# Patient Record
Sex: Female | Born: 1960 | Race: White | Hispanic: No | Marital: Married | Smoking: Never smoker
Health system: Southern US, Community
[De-identification: ages and names within clinical notes are randomized; demographics above are authoritative.]

## PROBLEM LIST (undated history)

## (undated) DIAGNOSIS — K219 Gastro-esophageal reflux disease without esophagitis: Secondary | ICD-10-CM

## (undated) DIAGNOSIS — F32A Depression, unspecified: Secondary | ICD-10-CM

## (undated) DIAGNOSIS — M1990 Unspecified osteoarthritis, unspecified site: Secondary | ICD-10-CM

## (undated) DIAGNOSIS — F329 Major depressive disorder, single episode, unspecified: Secondary | ICD-10-CM

## (undated) DIAGNOSIS — J45909 Unspecified asthma, uncomplicated: Secondary | ICD-10-CM

## (undated) HISTORY — PX: BREAST SURGERY: SHX581

---

## 1999-10-10 ENCOUNTER — Inpatient Hospital Stay (HOSPITAL_COMMUNITY): Admission: AD | Admit: 1999-10-10 | Discharge: 1999-10-13 | Payer: Self-pay | Admitting: Gynecology

## 1999-12-09 ENCOUNTER — Other Ambulatory Visit: Admission: RE | Admit: 1999-12-09 | Discharge: 1999-12-09 | Payer: Self-pay | Admitting: Gynecology

## 2001-01-14 ENCOUNTER — Other Ambulatory Visit: Admission: RE | Admit: 2001-01-14 | Discharge: 2001-01-14 | Payer: Self-pay | Admitting: Gynecology

## 2001-06-03 ENCOUNTER — Observation Stay (HOSPITAL_COMMUNITY): Admission: AD | Admit: 2001-06-03 | Discharge: 2001-06-04 | Payer: Self-pay | Admitting: Gynecology

## 2001-06-03 ENCOUNTER — Ambulatory Visit (HOSPITAL_COMMUNITY): Admission: RE | Admit: 2001-06-03 | Discharge: 2001-06-03 | Payer: Self-pay | Admitting: Gynecology

## 2001-06-03 ENCOUNTER — Encounter: Payer: Self-pay | Admitting: Gynecology

## 2001-07-26 ENCOUNTER — Inpatient Hospital Stay (HOSPITAL_COMMUNITY): Admission: AD | Admit: 2001-07-26 | Discharge: 2001-07-29 | Payer: Self-pay | Admitting: Gynecology

## 2001-09-11 ENCOUNTER — Other Ambulatory Visit: Admission: RE | Admit: 2001-09-11 | Discharge: 2001-09-11 | Payer: Self-pay | Admitting: Gynecology

## 2005-08-04 ENCOUNTER — Other Ambulatory Visit: Admission: RE | Admit: 2005-08-04 | Discharge: 2005-08-04 | Payer: Self-pay | Admitting: Gynecology

## 2006-08-17 ENCOUNTER — Other Ambulatory Visit: Admission: RE | Admit: 2006-08-17 | Discharge: 2006-08-17 | Payer: Self-pay | Admitting: Gynecology

## 2008-03-10 ENCOUNTER — Ambulatory Visit: Payer: Self-pay | Admitting: Gynecology

## 2008-03-10 ENCOUNTER — Other Ambulatory Visit: Admission: RE | Admit: 2008-03-10 | Discharge: 2008-03-10 | Payer: Self-pay | Admitting: Gynecology

## 2008-03-10 ENCOUNTER — Encounter: Payer: Self-pay | Admitting: Gynecology

## 2008-03-17 ENCOUNTER — Encounter: Payer: Self-pay | Admitting: Gynecology

## 2008-03-17 ENCOUNTER — Ambulatory Visit: Payer: Self-pay | Admitting: Gynecology

## 2008-07-20 ENCOUNTER — Ambulatory Visit: Payer: Self-pay | Admitting: Gynecology

## 2010-09-21 ENCOUNTER — Ambulatory Visit: Payer: 59 | Attending: Pediatrics | Admitting: Physical Therapy

## 2010-09-21 DIAGNOSIS — M545 Low back pain, unspecified: Secondary | ICD-10-CM | POA: Insufficient documentation

## 2010-09-21 DIAGNOSIS — M2560 Stiffness of unspecified joint, not elsewhere classified: Secondary | ICD-10-CM | POA: Insufficient documentation

## 2010-09-21 DIAGNOSIS — IMO0001 Reserved for inherently not codable concepts without codable children: Secondary | ICD-10-CM | POA: Insufficient documentation

## 2010-09-22 ENCOUNTER — Ambulatory Visit: Payer: 59 | Admitting: Rehabilitation

## 2010-09-23 NOTE — H&P (Signed)
[facility]  Patient:    Yolanda Marshall, Yolanda Marshall Visit Number: [id number] MRN: [id number]          Service Type: OBS Location: 910A 9129 01 Attending Physician:  Wetzel Bjornstad Dictated by:   Douglass Rivers, M.D. Admit Date:  06/03/2001                           History and Physical  CHIEF COMPLAINT:              Right lingular pneumonia.  HISTORY OF PRESENT ILLNESS:   The patient is a 50 year old G2, P45, with an LMP of October 24, 2000, and estimated date of confinement of July 31, 2001, estimated gestational age of 31-5/7 weeks, who presented to the office complaining of an upper respiratory tract infection and tightness in her chest, which has progressed despite a weeks worth of Ceftin. She has just completed the Ceftin, and in addition, she states that her cough is somewhat productive. She has no shortness of breath. She also began vomiting last night and Zofran ODT was called in and she had a good response. She began having diarrhea this morning, and was actually incontinent of stool. However, she has not vomited since the Zofran.  She was started on Ceftin on January 19 for a presumed bronchitis. She was seen in the office the following day, and her examination was felt to be unremarkable. The patient had a CBC, CMET and cold agglutinin drawn today, which are pending at the time of this dictation. She had a chest x-ray, PA and lateral which shows a lingular infiltrate consistent with pneumonia.  PAST OBSTETRIC HISTORY:       The patient is 31-5/7 weeks and has had an uncomplicated prenatal course with the acceptation of being advanced maternal age, and declined a amniocentesis. The patient had a previous cesarean section for breech and is planning a repeat. She is AB negative. Her husband is also Rh-negative.  PAST GYNECOLOGIC HISTORY:     Significant for unexplained infertility. She had diagnostic laparoscopy in 2000.  PAST MEDICAL HISTORY:          Negative.  PAST SURGICAL HISTORY:        Significant for the diagnostic laparoscopy in 2000, C-section 2001, and breast biopsy in 1998.  MEDICATIONS:                  1. Ceftin, just completed.                               2. Robitussin.                               3. Zofran ODT 4 mg q.8h. p.r.n.                               4. Prenatal vitamins and iron.  ALLERGIES:                    None.  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 She does not smoke nor drink.  PHYSICAL EXAMINATION:  GENERAL:  On physical examination she is a slightly ill-appearing, gravid female, in mild distress.  VITAL SIGNS:                  Blood pressure was 112/68.  HEART:                        On examination her heart is regular rate.  LUNGS:                        Her lungs had scattered wheezed throughout which cleared with deep inspiration and mild pulmonary toilet. She was noted to have coarse rhonchi on the right side. She was also noted to egophony at the right base and right anterior, as well as fremitus in both of these two places. The left side was negative for egophony and fremitus.  ABDOMEN:                      Gravid, soft, nontender. Fundal height of 31. No evidence of contractions. The fetal heart tones were auscultated.  PELVIC:                       On pelvic examination, her cervix was long, closed and high.  EXTREMITIES:                  No clubbing, cyanosis, or edema.  ASSESSMENT:                   Right lingular infiltrate, failed Ceftin therapy, cold agglutinins are pending. Expect that this could be an atypical pneumonia. She will be admitted to the hospital for macrolide therapy.  We will also consult with a infectious disease with regard to other pathogens to be covered. We will cover her diarrhea as needed which could be secondary to gastrointestinal bug or related to her recent Ceftin.  She will continue her Zofran ODT. We will follow up the results of her labs. Dictated by:   Douglass Rivers, M.D. Attending Physician:  Wetzel Bjornstad DD:  06/03/01 TD:  06/04/01 Job: [id number]

## 2010-09-23 NOTE — Op Note (Signed)
[facility]  Patient:    Yolanda Marshall, Yolanda Marshall                     MRN: [id number] Proc. Date: 10/10/99 Attending:  Merrily Pew                           Operative Report  PREOPERATIVE DIAGNOSIS:       Breech at term.  POSTOPERATIVE DIAGNOSIS:      Breech at term.  OPERATION:                    Primary cesarean section, low flat transverse.  SURGEON:                      Douglass Rivers, M.D.  ASSISTANT:                    Nadyne Coombes. Fontaine, M.D.  ANESTHESIA:                   Spinal.  ESTIMATED BLOOD LOSS:         600.  FINDINGS:                     A viable female in the frank breech presentation.  Clear amniotic fluid.  Normal uterus, tubes, and ovaries. Apgars 9 and 9.  Birth weight 3330 g.  Pathology:  None.  COMPLICATIONS:                None.  DESCRIPTION OF PROCEDURE:     The patient was taken to the operating room and spinal anesthesia was induced, then placed in the supine position with left lateral displacement, prepped and draped in the usual sterile fashion.  After adequate anesthesia was assured, a Pfannenstiel skin incision was made with a scalpel, carried to the underlying layer of fascia with a second blade, and the fascia was scored in the midline.  The incision was extended laterally with the Mayo scissors.  The inferior aspect of the fascial incision was grasped with Kochers, underlying rectus muscles were dissected off.  In a similar fashion, the superior aspect of the incision was grasped with Kochers and underlying rectus muscles were dissected off.  The rectus muscles were separate in the midline, the peritoneum was identified, and by blunt dissection, the peritoneal incision was then extended superiorly and inferiorly for visualization of the underlying bowel and bladder.  The bladder blade was inserted.  The orientation was confirmed.  The vesicouterine peritoneum was identified, tented up, and entered  sharply with Metzenbaums. The incision was extended laterally and the bladder flap was created digitally.  The bladder blade was then reinserted.  The lower uterine segment was incised in a transverse fashion with the scalpel.  Clear amniotic fluid was noted upon entering the cavity.  The infant was delivered from the frank breech presentation with the usual maneuvers.  The cord was cut and clamped and the infant handed off to the waiting pediatricians.  The cord bloods were obtained.  The uterus was massaged.  The placenta separated naturally.  The uterus was then cleared of all clots and debris.  The uterine incision was repaired with a running locked layer of 0 chromic.  The gutters were cleared of all clots and debris, the adnexa inspected and noted to be normal.  The pelvis was then irrigated.  Reinspection of the  incision assured hemostasis. Inspection of the peritoneum and muscle also assured hemostasis.   The fascia was then closed with 0 Vicryl, the subcutaneous tissue was irrigated.  The skin was closed with staples.  The patient tolerated the procedure well. Sponge, instrument, and needle counts correct x 2.  She was given Ancef intraoperatively and transferred to the PACU in stable condition. DD:  10/10/99 TD:  10/12/99 Job: [id number]

## 2010-09-23 NOTE — H&P (Signed)
[facility]  Patient:    Yolanda Marshall, Yolanda Marshall Visit Number: [id number] MRN: [id number]          Service Type: OBS Location: 910A 9129 01 Attending Physician:  Wetzel Bjornstad Dictated by:   Douglass Rivers, M.D. Admit Date:  06/03/2001 Discharge Date: 06/04/2001                           History and Physical  CHIEF COMPLAINT:              Term pregnancy, elective repeat cesarean section.  HISTORY OF PRESENT ILLNESS:   The patient is a 50 year old G2, P1-0-1-1, who delivered by C-section the first pregnancy because of breech, who presents now for elective repeat cesarean section.  Her LMP was October 24, 2000.  Estimated date of confinement is October 31, 2001.  Estimated gestational age is 29 weeks. The patient is without any complaints.  No contractions.  Good fetal movement. No vaginal bleeding and presents electively for surgery.  Her pregnancy was complicated by advanced maternal age, for which he had an amnio with normal chromosomes, AB-negative.  Her husband also is Rh negative.  She developed pneumonia at 31 weeks and required an overnight stay in the hospital for IV antibiotics after failing oral antibiotics, and then was continued on an oral regimen.  PRENATAL LABORATORY DATA:     She is AB-negative, antibody negative.  RPR nonreactive.  Rubella immune.  Hepatitis B surface antigen nonreactive.  HIV nonreactive.  GBS was deferred because she is having a C-section.  PAST OB/GYN HISTORY:          Significant for previous cesarean section in 2001.  He had diagnostic laparoscopy in 2000 for unexplained infertility.  She conceived this pregnancy ________ cycle.  Pap smears have been normal and her periods are regular.  PAST MEDICAL HISTORY:         Significant only for H. pylori in 1998.  PAST SURGICAL HISTORY:        Cesarean section and laparoscopy as mentioned above plus a breast biopsy in 1998.  FAMILY HISTORY:                Negative.  SOCIAL HISTORY:               She is married.  No alcohol, tobacco, or drugs.  MEDICATIONS:                  Prenatal vitamins and iron.  PHYSICAL EXAMINATION:  GENERAL:                      She is a gravid female in no acute distress.  HEENT:                        Unremarkable.  NECK:                         Thyroid is nontender and mobile.  HEART:                        Regular rate.  LUNGS:                        Clear to auscultation.  BREASTS:  Without mass, discharge, or retractions in the supine and upright position.  ABDOMEN:                      Gravid with fundal height of 38.  Fetal heart tones were auscultated.  VAGINAL EXAMINATION:          She is well-enclosed and posterior.  EXTREMITIES:                  No edema.  ASSESSMENT:                   Thirty-nine plus weeks for elective repeat                               cesarean section.  PLAN:                         Risks and benefits of the procedure were discussed with the patient including risks of bleeding, infection, and damage to the underlying bladder.  These were accepted.  The patient will therefore undergo surgery as scheduled. Dictated by:   Douglass Rivers, M.D. Attending Physician:  Wetzel Bjornstad DD:  07/25/01 TD:  07/25/01 Job: [id number]

## 2010-09-23 NOTE — Discharge Summary (Signed)
[facility]  Patient:    Yolanda Marshall, Yolanda Marshall                     MRN: [id number] Adm. Date:  [id number] Disc. Date: [id number] Attending:  Douglass Rivers Dictator:   Antony Contras, RNC, [facility]                           Discharge Summary  DISCHARGE DIAGNOSES:          1. Intrauterine pregnancy at 40-1/7 weeks.                               2. Homero Fellers breech presentation.  PROCEDURE:                    Low transverse cesarean section with delivery of viable infant.  HISTORY OF PRESENT ILLNESS:   The patient is a 50 year old, gravida 1, para 0-0-0, with an LMP of January 07, 1999, [facility] October 09, 1999.  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  The patient is also rh negative and advanced maternal age. The patient did decline amniocentesis and AFP.  PRENATAL LABORATORY DATA:     Blood type is AB negative.  Antibody screen negative, rubella immune.  RPR, HBSAG, and HIV nonreactive.  GBS negative.  HOSPITAL COURSE:              The patient was admitted on October 10, 1999, for low  transverse cesarean section secondary to breech presentation.  The procedure was performed by Douglass Rivers, M.D. and assisted by Nadyne Coombes. Fontaine, M.D. under spinal anesthesia.  The patient was delivered of an Apgars 9 and 9 female infant, weight 7 pounds 5 ounces.  Postpartum course was uncomplicated.  The patient remained afebrile with no difficulty voiding.  She did have some breastfeeding concerns and was seen by the lactation consultant during her stay.  Her baby was rh negative so she was not a RhoGAM candidate.  She was able to be discharged on her third postoperative day in satisfactory condition.  DISPOSITION:                  Follow up in the office in six weeks.  Continue with prenatal vitamins and iron.  Postpartum CBC; hematocrit was 32.6, hemoglobin 10.9, WBC 12.5, and patient 214.  Motrin and Tylox for pain. DD:  11/01/99 TD:  11/01/99 Job:  [id number]

## 2010-09-23 NOTE — Discharge Summary (Signed)
[facility]  Patient:    Yolanda Marshall, STANTZ Visit Number: [id number] MRN: [id number]          Service Type: OBS Location: 910A 9129 01 Attending Physician:  Wetzel Bjornstad Dictated by:   Antony Contras, [facility] Admit Date:  06/03/2001 Discharge Date: 06/04/2001                             Discharge Summary  DISCHARGE DIAGNOSIS:          Atypical pneumonia at 30 weeks. HISTORY OF PRESENT ILLNESS:   The patient is a 50 year old, gravida 2, para 1, with an LMP of October 24, 2000, [facility] July 31, 2001, estimated gestational age on admission 31.5 weeks. The patient presented to the office a week ago with upper respiratory infection and tightness which progressed despite a weeks worth of Ceftin. The patient has continued to complain of a cough and also did develop some nausea, vomiting, and diarrhea. Chest x-ray PA and lateral showed a _______ infiltrate consistent with pneumonia. Patient was admitted to the hospital for macrolide therapy and also infectious disease consult was obtained.  HOSPITAL COURSE/TREATMENT:    The patient was admitted for IV cephalexin and Zithromax. She was feeling much better by the next day and was able to be discharged in satisfactory condition.  LABORATORY DATA:              WBC was 6.6.  DISCHARGE FOLLOWUP:           The patient is to follow up in the office on June 07, 2001.  DISPOSITION:                  Follow up for routine prenatal care. Follow up for post-hospital evaluation June 07, 2001. Dictated by:   Antony Contras, [facility] Attending Physician:  Wetzel Bjornstad DD:  06/14/01 TD:  06/16/01 Job: [id number]

## 2010-09-23 NOTE — Op Note (Signed)
[facility]  Patient:    Yolanda Marshall, Yolanda Marshall Visit Number: [id number] MRN: [id number]          Service Type: OBS Location: 910A 9119 01 Attending Physician:  Douglass Rivers Dictated by:   Douglass Rivers, M.D. Proc. Date: 07/26/01 Admit Date:  07/26/2001                             Operative Report  PREOPERATIVE DIAGNOSES:       1. Previous cesarean section.                               2. Intrauterine pregnancy at 39+ weeks.  POSTOPERATIVE DIAGNOSES:      1. Previous cesarean section.                               2. Intrauterine pregnancy at 39+ weeks.  PROCEDURE:                    Elective repeat cesarean section.  SURGEON:                      Douglass Rivers, M.D.  ASSISTANT:                    Katy Fitch, M.D.  ANESTHESIA:                   Spinal.  ESTIMATED BLOOD LOSS:         400 cc.  URINE OUTPUT:                 100 cc of clear urine.  FINDINGS:                     Viable female infant in the vertex presentation. Clear amniotic fluid.  Apgars 9 and 9.  Birth weight 713.  Normal uterus, tubes, and ovaries.  COMPLICATIONS:                None.  PATHOLOGY:                    None.  PROCEDURE:                    Patient was taken to the operating room.  Spinal anesthesia was induced and placed in the supine position with left lateral tilt.  Prepped and draped using sterile fashion.  A Pfannenstiel skin incision with the scalpel was carried through the underlying layer.  Fascia was dissected with electrocautery.  The rectus muscles were noted to be markedly separated and markedly scarred.  The inferior aspect of the fascial incision was grasped with Kochers.  Underlying rectus muscles were dissected off sharply.  The superior aspect of the incision was grasped with Kochers.  The underlying rectus muscles were separated off.  The peritoneal cavity was entered incidentally secondary to the large diastasis of the muscles.  With  an examining finger we were able to be aware that we were free of underlying bowel.  The incision was carried superiorly.  The rectus muscles inferiorly were separated sharply because of some scar tissue.  There was a small amount of bleeding noted around the space of Retzius that was treated with a figure-of-eight of chromic and was noted  to be hemostatic.  Attention was then turned back to the incision.  The bladder blade was inserted.  The orientation was confirmed.  The vesicouterine peritoneum was identified, tented up, entered sharply with Metzenbaum scissors.  Incision was then extended laterally.  The bladder flap was created digitally.  There was noted to be ______ of the lower uterine segment which was incised with a scalpel.  Upon entering the cavity a copious amount of clear amniotic fluid was noted.  The infant was delivered from the vertex presentation.  Cord was cut and clamped and then handed off to the awaiting pediatricians.  Cord bloods were obtained. The uterus was massaged and the placenta was allowed to separate naturally. The uterus was then cleared of all clots and debris.  The uterine incision as repaired with a running locked layer of 0 chromic.  The pelvis was irrigated with copious amounts of warm saline.  The adnexa were inspected and noted to be unremarkable.  Reinspection of the incision assured [place] of hemostasis.  As we began to close the fascia with 0 Vicryl we noticed what appeared to be a large amount of fluid that kept welling up in the incision.  We had already checked the space of Retzius which was noted to be hemostatic.  Because we had entered the peritoneum sharply incidentally the decision was made to retrograde fill the bladder with methylene blue stained normal saline.  After 350 cc were placed we assure ourselves that there was no defect in the integrity of the bladder.  The fluid was removed and the fascia was then closed with 0 Vicryl in a  running fashion.  The subcutaneous was irrigated and the skin was closed with staples.  The patient tolerated the procedure well. Sponge, lap, and needle counts were correct x2.  She was given Ancef intraoperatively and transferred to the PACU in stable condition. Dictated by:   Douglass Rivers, M.D. Attending Physician:  Douglass Rivers DD:  07/26/01 TD:  07/29/01 Job: [id number]

## 2010-09-23 NOTE — Discharge Summary (Signed)
[facility]  Patient:    Yolanda Marshall, Yolanda Marshall Visit Number: [id number] MRN: [id number]          Service Type: OBS Location: 910A 9119 01 Attending Physician:  Douglass Rivers Dictated by:   Antony Contras, [facility] Admit Date:  07/26/2001 Discharge Date: 07/29/2001                             Discharge Summary  DISCHARGE DIAGNOSES:          1. Intrauterine pregnancy at 39 weeks.                               2. History of previous cesarean section.  PROCEDURES,                   Elective repeat cesarean section.  HISTORY OF PRESENT ILLNESS:   The patient is a 51 year old, gravida 2, para 1-0-0-1, with LMP October 24, 2000, [facility] March 26,2003.  Prenatal risk factors include history of previous cesarean section.  The patient desires repeat. The patient is also Rh negative and so is husband.  PRENATAL LABORATORY DATA:     Blood type AB negative, antibody screen negative, RPR, HBsAg, HIV nonreactive.  The patient declines MSAFP.  HOSPITAL COURSE AND TREATMENT: The patient was admitted at 39 weeks for elective repeat cesarean section.  Procedure was performed by Dr. Farrel Gobble, assisted by Dr. Penni Homans under spinal anesthesia.  Findings include viable infant in vertex presentation, female, clear amniotic fluid, Apgars 9/9, birth weight 7 pounds 13 ounces.  Normal uterus, tubes, and ovaries.  During postpartum course, patient remained afebrile.  She had no difficulty voiding.  She was able to be discharged in satisfactory condition on her third postoperative day.  CBC hematocrit 32.8, hemoglobin 11, WBC 12.3, platelets 208.  DISPOSITION:                  Follow up in office in six weeks.  Continue prenatal vitamins and iron.  Motrin and Tylox for pain. Dictated by:   Antony Contras, [facility] Attending Physician:  Douglass Rivers DD:  08/12/01 TD:  08/12/01 Job: [id number]

## 2010-09-26 ENCOUNTER — Ambulatory Visit: Payer: 59 | Admitting: Rehabilitation

## 2010-09-28 ENCOUNTER — Ambulatory Visit: Payer: 59 | Admitting: Physical Therapy

## 2010-10-04 ENCOUNTER — Ambulatory Visit: Payer: 59 | Admitting: Rehabilitation

## 2010-10-06 ENCOUNTER — Ambulatory Visit: Payer: 59 | Admitting: Rehabilitation

## 2010-10-10 ENCOUNTER — Ambulatory Visit: Payer: 59 | Attending: Pediatrics | Admitting: Physical Therapy

## 2010-10-10 DIAGNOSIS — M545 Low back pain, unspecified: Secondary | ICD-10-CM | POA: Insufficient documentation

## 2010-10-10 DIAGNOSIS — M2560 Stiffness of unspecified joint, not elsewhere classified: Secondary | ICD-10-CM | POA: Insufficient documentation

## 2010-10-10 DIAGNOSIS — IMO0001 Reserved for inherently not codable concepts without codable children: Secondary | ICD-10-CM | POA: Insufficient documentation

## 2010-10-12 ENCOUNTER — Encounter: Payer: 59 | Admitting: Physical Therapy

## 2010-10-17 ENCOUNTER — Encounter: Payer: 59 | Admitting: Physical Therapy

## 2010-10-21 ENCOUNTER — Encounter: Payer: 59 | Admitting: Physical Therapy

## 2011-06-23 ENCOUNTER — Emergency Department (HOSPITAL_BASED_OUTPATIENT_CLINIC_OR_DEPARTMENT_OTHER)
Admission: EM | Admit: 2011-06-23 | Discharge: 2011-06-23 | Disposition: A | Discharge status: Home / Self Care | Payer: 59 | Attending: Emergency Medicine | Admitting: Emergency Medicine

## 2011-06-23 ENCOUNTER — Emergency Department (INDEPENDENT_AMBULATORY_CARE_PROVIDER_SITE_OTHER): Payer: 59

## 2011-06-23 ENCOUNTER — Encounter (HOSPITAL_BASED_OUTPATIENT_CLINIC_OR_DEPARTMENT_OTHER): Payer: Self-pay | Admitting: *Deleted

## 2011-06-23 DIAGNOSIS — M25429 Effusion, unspecified elbow: Secondary | ICD-10-CM | POA: Insufficient documentation

## 2011-06-23 DIAGNOSIS — W1809XA Striking against other object with subsequent fall, initial encounter: Secondary | ICD-10-CM | POA: Insufficient documentation

## 2011-06-23 DIAGNOSIS — S52123A Displaced fracture of head of unspecified radius, initial encounter for closed fracture: Secondary | ICD-10-CM

## 2011-06-23 DIAGNOSIS — Y92009 Unspecified place in unspecified non-institutional (private) residence as the place of occurrence of the external cause: Secondary | ICD-10-CM | POA: Insufficient documentation

## 2011-06-23 DIAGNOSIS — X58XXXA Exposure to other specified factors, initial encounter: Secondary | ICD-10-CM

## 2011-06-23 DIAGNOSIS — Z79899 Other long term (current) drug therapy: Secondary | ICD-10-CM | POA: Insufficient documentation

## 2011-06-23 HISTORY — DX: Depression, unspecified: F32.A

## 2011-06-23 HISTORY — DX: Major depressive disorder, single episode, unspecified: F32.9

## 2011-06-23 HISTORY — DX: Unspecified osteoarthritis, unspecified site: M19.90

## 2011-06-23 MED ORDER — HYDROCODONE-ACETAMINOPHEN 5-325 MG PO TABS: 2.0000 | ORAL_TABLET | ORAL | Status: AC | PRN

## 2011-06-23 NOTE — ED Provider Notes (Signed)
History     CSN: [id number]  Arrival date & time 06/23/11  1742   First MD Initiated Contact with Patient 06/23/11 1852      Chief Complaint  Patient presents with  . Arm Injury    (Consider location/radiation/quality/duration/timing/severity/associated sxs/prior treatment) HPI Comments: Patient presents with head and right elbow pain after falling on a pogo stick. She hit her head against a refrigerator but did not lose consciousness. She also injured her right elbow. There is pain to palpation and with flexion and extension. She denies any weakness, numbness, tingling, chest pain, abdominal pain or shortness of breath.  The history is provided by the patient.    Past Medical History  Diagnosis Date  . Arthritis   . Depression     Past Surgical History  Procedure Date  . Breast surgery     No family history on file.  History  Substance Use Topics  . Smoking status: Never Smoker   . Smokeless tobacco: Not on file  . Alcohol Use: No    OB History    Grav Para Term Preterm Abortions TAB SAB Ect Mult Living                  Review of Systems  All other systems reviewed and are negative.    Allergies  Review of patient's allergies indicates no known allergies.  Home Medications   Current Outpatient Rx  Name Route Sig Dispense Refill  . ACETAMINOPHEN 500 MG PO TABS Oral Take 1,000 mg by mouth every 6 (six) hours as needed. For pain    . BUSPIRONE HCL 15 MG PO TABS Oral Take 15 mg by mouth 2 (two) times daily.    Marland Kitchen DIPHENHYDRAMINE HCL 25 MG PO CAPS Oral Take 25 mg by mouth every 6 (six) hours as needed. For allergies    . ESCITALOPRAM OXALATE 20 MG PO TABS Oral Take 20 mg by mouth daily.    Marland Kitchen HYDROCODONE-ACETAMINOPHEN 5-500 MG PO TABS Oral Take 1 tablet by mouth every 6 (six) hours as needed. For pain    . HYDROCODONE-ACETAMINOPHEN 5-325 MG PO TABS Oral Take 2 tablets by mouth every 4 (four) hours as needed for pain. 10 tablet 0    BP 125/81  Pulse 97   Temp(Src) 98.6 F (37 C) (Oral)  Resp 20  SpO2 100%  LMP 06/02/2011  Physical Exam  Constitutional: She is oriented to person, place, and time. She appears well-developed and well-nourished. No distress.  HENT:  Head: Normocephalic and atraumatic.  Mouth/Throat: Oropharynx is clear and moist. No oropharyngeal exudate.  Eyes: Conjunctivae are normal. Pupils are equal, round, and reactive to light.  Neck: Normal range of motion. Neck supple.       No C-spine pain, step-off or foreign  Cardiovascular: Normal rate, regular rhythm and normal heart sounds.   Pulmonary/Chest: Effort normal and breath sounds normal. No respiratory distress.  Abdominal: Soft. There is no tenderness. There is no rebound and no guarding.  Musculoskeletal: Normal range of motion. She exhibits no edema and no tenderness.       Tenderness to palpation over the right lateral and medial epicondyle. Pain rigors range of motion with flexion, extension, pronation and supination. Posterior pulse, cardinal hand movements intact  Neurological: She is alert and oriented to person, place, and time. No cranial nerve deficit.  Skin: Skin is warm.    ED Course  Procedures (including critical care time)  Labs Reviewed - No data to display Dg Elbow  Complete Right  06/23/2011  *RADIOLOGY REPORT*  Clinical Data: Right forearm and elbow injury today.  Elbow pain.  RIGHT ELBOW - COMPLETE 3+ VIEW  Comparison: None.  Findings: There is a nondisplaced right radial head fracture involving the lateral aspect of the radial head and with intra- articular extension.  Elbow effusion is present with anterior fat pad sign.  The ulna appears intact.  Alignment the elbow is anatomic.  Capitellum appears normal.  IMPRESSION: Nondisplaced lateral right radial head fracture.  Original Report Authenticated By: Andreas Newport, M.D.   Dg Forearm Right  06/23/2011  *RADIOLOGY REPORT*  Clinical Data: Right forearm and elbow injury.  Pain.  RIGHT FOREARM -  2 VIEW  Comparison: None.  Findings: There is a nondisplaced right radial head fracture noted on the frontal view.  Effusion is present on the lateral view with displacement of the anterior fat pad.  The radial head fracture is difficult to appreciate on the lateral view.  IMPRESSION: Nondisplaced right radial head fracture.  Original Report Authenticated By: Andreas Newport, M.D.     1. Radial head fracture       MDM  Right elbow injury after fall. Minor head injury without loss of consciousness or neurological deficit.  X-ray consistent with radial head fracture. Sling, pain control, ortho referral.        Glynn Octave, MD 06/23/11 1950

## 2011-06-23 NOTE — ED Notes (Signed)
Pt. Alert and oriented, discharged to home, pt. Ambulatory gait steady, NAD noted 

## 2011-06-23 NOTE — Discharge Instructions (Signed)
Elbow Fracture, Simple     A fracture is a break in one of the bones.When fractures are not displaced or separated, they may be treated with only a sling or splint. The sling or splint may only be required for two to three weeks. In these cases, often the elbow is put through early range of motion exercises to prevent the elbow from getting stiff.  DIAGNOSIS   The diagnosis (learning what is wrong) of a fractured elbow is made by x-ray. These may be required before and after the elbow is put into a splint or cast. X-rays are taken after to make sure the bone pieces have not moved.  HOME CARE INSTRUCTIONS   · Only take over-the-counter or prescription medicines for pain, discomfort, or fever as directed by your caregiver.   · If you have a splint held on with an elastic wrap and your hand or fingers become numb or cold and blue, loosen the wrap and reapply more loosely. See your caregiver if there is no relief.   · You may use ice for twenty minutes, four times per day, for the first two to three days.   · Use your elbow as directed.   · See your caregiver as directed. It is very important to keep all follow-up referrals and appointments in order to avoid any long-term problems with your elbow including chronic pain or stiffness.   SEEK IMMEDIATE MEDICAL CARE IF:   · There is swelling or increasing pain in elbow.   · You begin to lose feeling or experience numbness or tingling in your hand or fingers.   · You develop swelling of the hand and fingers.   · You get a cold or blue hand or fingers on affected side.   MAKE SURE YOU:   · Understand these instructions.   · Will watch your condition.   · Will get help right away if you are not doing well or get worse.   Document Released: 04/18/2001 Document Revised: 01/04/2011 Document Reviewed: 03/09/2009  [employer]® Patient Information ©2012 [employer].

## 2011-06-23 NOTE — ED Notes (Signed)
Right elbow and forearm injury. Fell while jumping on pogo stick.

## 2011-06-26 ENCOUNTER — Other Ambulatory Visit (HOSPITAL_BASED_OUTPATIENT_CLINIC_OR_DEPARTMENT_OTHER): Payer: Self-pay | Admitting: Pediatrics

## 2011-06-26 DIAGNOSIS — Z1231 Encounter for screening mammogram for malignant neoplasm of breast: Secondary | ICD-10-CM

## 2011-06-27 ENCOUNTER — Ambulatory Visit (HOSPITAL_BASED_OUTPATIENT_CLINIC_OR_DEPARTMENT_OTHER)
Admission: RE | Admit: 2011-06-27 | Discharge: 2011-06-27 | Disposition: A | Discharge status: Home / Self Care | Payer: 59 | Source: Ambulatory Visit | Attending: Pediatrics | Admitting: Pediatrics

## 2011-06-27 DIAGNOSIS — Z1231 Encounter for screening mammogram for malignant neoplasm of breast: Secondary | ICD-10-CM

## 2011-08-16 ENCOUNTER — Ambulatory Visit: Payer: 59 | Attending: Sports Medicine | Admitting: Physical Therapy

## 2011-08-16 DIAGNOSIS — M25539 Pain in unspecified wrist: Secondary | ICD-10-CM | POA: Insufficient documentation

## 2011-08-16 DIAGNOSIS — M25639 Stiffness of unspecified wrist, not elsewhere classified: Secondary | ICD-10-CM | POA: Insufficient documentation

## 2011-08-16 DIAGNOSIS — IMO0001 Reserved for inherently not codable concepts without codable children: Secondary | ICD-10-CM | POA: Insufficient documentation

## 2011-08-22 ENCOUNTER — Ambulatory Visit: Payer: 59 | Admitting: Rehabilitation

## 2011-08-23 ENCOUNTER — Ambulatory Visit: Payer: 59 | Admitting: Physical Therapy

## 2011-08-29 ENCOUNTER — Ambulatory Visit: Payer: 59 | Admitting: Physical Therapy

## 2011-08-30 ENCOUNTER — Ambulatory Visit: Payer: 59 | Admitting: Physical Therapy

## 2011-09-04 ENCOUNTER — Ambulatory Visit: Payer: 59 | Admitting: Physical Therapy

## 2011-09-05 ENCOUNTER — Ambulatory Visit: Payer: 59 | Admitting: Physical Therapy

## 2011-09-12 ENCOUNTER — Ambulatory Visit: Payer: 59 | Attending: Sports Medicine | Admitting: Physical Therapy

## 2011-09-12 DIAGNOSIS — IMO0001 Reserved for inherently not codable concepts without codable children: Secondary | ICD-10-CM | POA: Insufficient documentation

## 2011-09-12 DIAGNOSIS — M25639 Stiffness of unspecified wrist, not elsewhere classified: Secondary | ICD-10-CM | POA: Insufficient documentation

## 2011-09-12 DIAGNOSIS — M25539 Pain in unspecified wrist: Secondary | ICD-10-CM | POA: Insufficient documentation

## 2011-09-13 ENCOUNTER — Ambulatory Visit: Payer: 59 | Admitting: Physical Therapy

## 2012-07-15 ENCOUNTER — Other Ambulatory Visit (HOSPITAL_BASED_OUTPATIENT_CLINIC_OR_DEPARTMENT_OTHER): Payer: Self-pay | Admitting: Physical Medicine and Rehabilitation

## 2012-07-15 ENCOUNTER — Ambulatory Visit (HOSPITAL_BASED_OUTPATIENT_CLINIC_OR_DEPARTMENT_OTHER)
Admission: RE | Admit: 2012-07-15 | Discharge: 2012-07-15 | Disposition: A | Discharge status: Home / Self Care | Payer: 59 | Source: Ambulatory Visit | Attending: Physical Medicine and Rehabilitation | Admitting: Physical Medicine and Rehabilitation

## 2012-07-15 DIAGNOSIS — M79609 Pain in unspecified limb: Secondary | ICD-10-CM | POA: Insufficient documentation

## 2012-08-30 ENCOUNTER — Other Ambulatory Visit (HOSPITAL_BASED_OUTPATIENT_CLINIC_OR_DEPARTMENT_OTHER): Payer: Self-pay | Admitting: Physical Medicine and Rehabilitation

## 2012-08-30 DIAGNOSIS — IMO0002 Reserved for concepts with insufficient information to code with codable children: Secondary | ICD-10-CM

## 2012-09-21 ENCOUNTER — Ambulatory Visit (HOSPITAL_BASED_OUTPATIENT_CLINIC_OR_DEPARTMENT_OTHER)
Admission: RE | Admit: 2012-09-21 | Discharge: 2012-09-21 | Disposition: A | Discharge status: Home / Self Care | Payer: 59 | Source: Ambulatory Visit | Attending: Physical Medicine and Rehabilitation | Admitting: Physical Medicine and Rehabilitation

## 2012-09-21 DIAGNOSIS — M79609 Pain in unspecified limb: Secondary | ICD-10-CM | POA: Insufficient documentation

## 2012-09-21 DIAGNOSIS — IMO0002 Reserved for concepts with insufficient information to code with codable children: Secondary | ICD-10-CM

## 2012-09-21 DIAGNOSIS — R209 Unspecified disturbances of skin sensation: Secondary | ICD-10-CM | POA: Insufficient documentation

## 2013-01-11 ENCOUNTER — Emergency Department (HOSPITAL_BASED_OUTPATIENT_CLINIC_OR_DEPARTMENT_OTHER)
Admission: EM | Admit: 2013-01-11 | Discharge: 2013-01-12 | Disposition: A | Discharge status: Home / Self Care | Payer: 59 | Attending: Emergency Medicine | Admitting: Emergency Medicine

## 2013-01-11 ENCOUNTER — Emergency Department (HOSPITAL_BASED_OUTPATIENT_CLINIC_OR_DEPARTMENT_OTHER): Payer: 59

## 2013-01-11 ENCOUNTER — Encounter (HOSPITAL_BASED_OUTPATIENT_CLINIC_OR_DEPARTMENT_OTHER): Payer: Self-pay | Admitting: *Deleted

## 2013-01-11 DIAGNOSIS — F329 Major depressive disorder, single episode, unspecified: Secondary | ICD-10-CM | POA: Insufficient documentation

## 2013-01-11 DIAGNOSIS — Z3202 Encounter for pregnancy test, result negative: Secondary | ICD-10-CM | POA: Insufficient documentation

## 2013-01-11 DIAGNOSIS — F3289 Other specified depressive episodes: Secondary | ICD-10-CM | POA: Insufficient documentation

## 2013-01-11 DIAGNOSIS — R6883 Chills (without fever): Secondary | ICD-10-CM | POA: Insufficient documentation

## 2013-01-11 DIAGNOSIS — K219 Gastro-esophageal reflux disease without esophagitis: Secondary | ICD-10-CM | POA: Insufficient documentation

## 2013-01-11 DIAGNOSIS — J45909 Unspecified asthma, uncomplicated: Secondary | ICD-10-CM | POA: Insufficient documentation

## 2013-01-11 DIAGNOSIS — M549 Dorsalgia, unspecified: Secondary | ICD-10-CM | POA: Insufficient documentation

## 2013-01-11 DIAGNOSIS — Z79899 Other long term (current) drug therapy: Secondary | ICD-10-CM | POA: Insufficient documentation

## 2013-01-11 DIAGNOSIS — M129 Arthropathy, unspecified: Secondary | ICD-10-CM | POA: Insufficient documentation

## 2013-01-11 DIAGNOSIS — K5900 Constipation, unspecified: Secondary | ICD-10-CM | POA: Insufficient documentation

## 2013-01-11 DIAGNOSIS — R109 Unspecified abdominal pain: Secondary | ICD-10-CM | POA: Insufficient documentation

## 2013-01-11 DIAGNOSIS — R110 Nausea: Secondary | ICD-10-CM | POA: Insufficient documentation

## 2013-01-11 HISTORY — DX: Gastro-esophageal reflux disease without esophagitis: K21.9

## 2013-01-11 HISTORY — DX: Unspecified asthma, uncomplicated: J45.909

## 2013-01-11 LAB — CBC WITH DIFFERENTIAL/PLATELET
HCT: 39.6 % (ref 36.0–46.0)
Hemoglobin: 13.2 g/dL (ref 12.0–15.0)
Lymphs Abs: 1.8 10*3/uL (ref 0.7–4.0)
MCH: 31.4 pg (ref 26.0–34.0)
Monocytes Relative: 5 % (ref 3–12)
Neutro Abs: 10 10*3/uL — ABNORMAL HIGH (ref 1.7–7.7)
Neutrophils Relative %: 79 % — ABNORMAL HIGH (ref 43–77)
RBC: 4.21 MIL/uL (ref 3.87–5.11)

## 2013-01-11 LAB — URINALYSIS, ROUTINE W REFLEX MICROSCOPIC
Glucose, UA: NEGATIVE mg/dL
Hgb urine dipstick: NEGATIVE
Ketones, ur: NEGATIVE mg/dL
Leukocytes, UA: NEGATIVE
pH: 6.5 (ref 5.0–8.0)

## 2013-01-11 LAB — BASIC METABOLIC PANEL
BUN: 8 mg/dL (ref 6–23)
Chloride: 102 mEq/L (ref 96–112)
Glucose, Bld: 106 mg/dL — ABNORMAL HIGH (ref 70–99)
Potassium: 4.7 mEq/L (ref 3.5–5.1)

## 2013-01-11 MED ORDER — ONDANSETRON HCL 4 MG/2ML IJ SOLN: 4.0000 mg | Freq: Four times a day (QID) | INTRAMUSCULAR | Status: DC | PRN

## 2013-01-11 MED ORDER — MORPHINE SULFATE 4 MG/ML IJ SOLN: 4.0000 mg | INTRAMUSCULAR | Status: DC | PRN

## 2013-01-11 NOTE — ED Notes (Signed)
Pt denies any abd pain or nausea at this time. Request not to have IV placed at this time and did not want any meds for nausea or pain.

## 2013-01-11 NOTE — ED Notes (Signed)
Lower abdominal pain that started about 45 minutes ago with nausea.

## 2013-01-11 NOTE — ED Provider Notes (Signed)
CSN: [id number]     Arrival date & time 01/11/13  2013 History  This chart was scribed for Derwood Kaplan, MD by Ronal Fear, ED Scribe. This patient was seen in room MH04/MH04 and the patient's care was started at 9:20 PM.   Chief Complaint  Patient presents with  . Abdominal Pain   The history is provided by the patient. No language interpreter was used.    HPI Comments: Yolanda Marshall is a 52 y.o. female with h/o chronic constipation who presents to the Emergency Department complaining of gradually resolving lower abdominal pain described as a tightness that radiates to her back onet 1 hour PTA with associated nausea and chills. Pt states this feels like her normal pain from her constipation. She had a bowel movement which normally helps but didn't this time. She denies dysuria as an associated symptom. Pt has asthma and allergies. She is a nonsmoker. Pt denies diabetes, heart disease, or any other medical conditions.    Past Medical History  Diagnosis Date  . Arthritis   . Depression   . Asthma   . GERD (gastroesophageal reflux disease)    Past Surgical History  Procedure Laterality Date  . Breast surgery    . Cesarean section     History reviewed. No pertinent family history. History  Substance Use Topics  . Smoking status: Never Smoker   . Smokeless tobacco: Not on file  . Alcohol Use: No   OB History   Grav Para Term Preterm Abortions TAB SAB Ect Mult Living                 Review of Systems  Constitutional: Positive for chills.  Gastrointestinal: Positive for nausea, abdominal pain and constipation.  Genitourinary: Negative for dysuria.  Musculoskeletal: Positive for back pain.  All other systems reviewed and are negative.    Allergies  Review of patient's allergies indicates no known allergies.  Home Medications   Current Outpatient Rx  Name  Route  Sig  Dispense  Refill  . beclomethasone (QVAR) 40 MCG/ACT inhaler   Inhalation   Inhale 2 puffs into the  lungs 2 (two) times daily.         . diphenhydrAMINE (BENADRYL) 25 mg capsule   Oral   Take 25 mg by mouth every 6 (six) hours as needed. For allergies         . escitalopram (LEXAPRO) 20 MG tablet   Oral   Take 20 mg by mouth daily.         . montelukast (SINGULAIR) 10 MG tablet   Oral   Take 10 mg by mouth at bedtime.         Marland Kitchen omeprazole (PRILOSEC) 10 MG capsule   Oral   Take 10 mg by mouth daily.         Marland Kitchen acetaminophen (TYLENOL) 500 MG tablet   Oral   Take 1,000 mg by mouth every 6 (six) hours as needed. For pain         . busPIRone (BUSPAR) 15 MG tablet   Oral   Take 15 mg by mouth 2 (two) times daily.         Marland Kitchen HYDROcodone-acetaminophen (VICODIN) 5-500 MG per tablet   Oral   Take 1 tablet by mouth every 6 (six) hours as needed. For pain          BP 117/73  Pulse 56  Temp(Src) 97.9 F (36.6 C)  Resp 16  Ht 5\' 4"  (1.626 m)  Wt 199 lb (90.266 kg)  BMI 34.14 kg/m2  SpO2 99%  LMP 06/02/2011 Physical Exam  Nursing note and vitals reviewed. Constitutional: She is oriented to person, place, and time. She appears well-developed and well-nourished. No distress.  HENT:  Head: Normocephalic and atraumatic.  Eyes: EOM are normal.  Neck: Neck supple. No tracheal deviation present.  Cardiovascular: Normal rate, regular rhythm and normal heart sounds.   No murmur heard. Pulmonary/Chest: Effort normal and breath sounds normal. No respiratory distress. She has no wheezes. She has no rales.  Abdominal: Soft. There is no tenderness.  Musculoskeletal: Normal range of motion.  Neurological: She is alert and oriented to person, place, and time.  Skin: Skin is warm and dry.  Psychiatric: She has a normal mood and affect. Her behavior is normal.    ED Course  Procedures (including critical care time)  DIAGNOSTIC STUDIES: Oxygen Saturation is 99 on RA normal by my interpretation.    COORDINATION OF CARE: 8:35 PM- Pt advised of plan for treatment including  reasons for sudden onset pain, doing an acute abdominal series and a urine sample and pt agrees.   Labs Review Labs Reviewed  URINALYSIS, ROUTINE W REFLEX MICROSCOPIC  PREGNANCY, URINE   Imaging Review No results found.  MDM  No diagnosis found. I personally performed the services described in this documentation, which was scribed in my presence. The recorded information has been reviewed and is accurate.  Pt comes in with cc of abd pain. Pt has hx of constipation, chronic pain of similar nature - typically due to constipations. Today had more severe pain, radiating to the back and didn't respond to bowel movement, like he typically does.  Pt is in no pain at my evaluation, had mild left sided abd pain, LLQ. Pan was severe, so ddx - ovarian torsion, renal stone, SBO. No hx of renal stones, no uti like sx  Initial labs are WNL. Repeat exam - patient is pain free.  No leukocytosis - so we will not presume diverticulitis, in light of improving exam. Pt will see her pcp.     Derwood Kaplan, MD 01/12/13 [phone]

## 2013-01-12 MED ORDER — ONDANSETRON 8 MG PO TBDP: 8.0000 mg | ORAL_TABLET | Freq: Three times a day (TID) | ORAL | Status: AC | PRN

## 2013-01-12 NOTE — ED Notes (Signed)
Pt reports lower abd pain that began approx PTA, currently pt denies any abd pain - pt also admits to some nausea denies vomiting. Pt A&Ox4, in no acute distress, denies any associating symptoms.

## 2013-10-28 ENCOUNTER — Ambulatory Visit (INDEPENDENT_AMBULATORY_CARE_PROVIDER_SITE_OTHER): Payer: 59 | Admitting: Neurology

## 2013-10-28 ENCOUNTER — Encounter: Payer: Self-pay | Admitting: Neurology

## 2013-10-28 VITALS — BP 128/77 | HR 75 | Temp 98.0°F | Ht 64.0 in | Wt 204.0 lb

## 2013-10-28 DIAGNOSIS — R51 Headache: Secondary | ICD-10-CM

## 2013-10-28 DIAGNOSIS — G4710 Hypersomnia, unspecified: Secondary | ICD-10-CM

## 2013-10-28 DIAGNOSIS — R519 Headache, unspecified: Secondary | ICD-10-CM

## 2013-10-28 NOTE — Patient Instructions (Signed)
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 We will do further testing with a sleep study and a nap study. We may have to try different medications that may help you stay awake during the day. Not everything works with everybody the same way. Wake promoting agents include stimulants and non-stimulant type medications. The most common side effects with stimulants are weight loss, insomnia, nervousness, headaches, palpitations, rise in blood pressure, anxiety. Stimulants can be addictive and subject to abuse. Non-stimulant type wake promoting medications include Provigil and Nuvigil, most common side effects include headaches, nervousness, insomnia, hypertension.

## 2013-10-28 NOTE — Progress Notes (Signed)
Subjective:    Patient ID: Yolanda Marshall is a 53 y.o. female.  HPI    Huston Foley, MD, PhD [facility] [address] [address] [place], [place] [address]  Dear Dr. Hal Hope,   I saw your patient, Yolanda Marshall, upon your kind request in my neurologic clinic today for initial consultation of her sleep disorder, in particular her excessive somnolence. The patient is unaccompanied today. As you know, Yolanda Marshall is a 53 year old right-handed woman with an underlying medical history of depression, obesity, allergic rhinitis, and asthma, who has been experiencing significant daytime somnolence for the past several years, at least 10 years. She has been on an antidepressant off and on for years. She has been on Effexor and Zoloft and has attributed her sleepiness to underlying anxiety and depression. She has seen Dr. Jerre Simon in sleep clinic not too long ago. She has reduced her antidepressant but still has significant sleepiness. He scheduled her for a sleep study and not study. He advised her that she would have to stop the Lexapro at least 2 weeks prior to testing. 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 In preparation for the sleep studies, she came off of Buspar, and Lexapro, but had a very difficult time coming off of Lexapro in a matter of 2 weeks. She currently is off of both medication. She has a 51 yo and a 51 yo.  She currently does not feel depressed. She has 2 cats, sometimes in the bed. Her husband snores and she sleeps with earplugs. She naps for as long as 4-6 hours.  Her typical bedtime is reported to be around 10 to 10:30 PM and usual wake time is around 6:45 AM. Sleep onset typically occurs within minutes. She reports feeling poorly rested upon awakening. She wakes up on an average 1 times in the middle of the night and has to go to the bathroom 0 to 1 times on a typical night. She reports occasional  morning headaches. She has had a few episodes of sleep paralysis in her lifetime, and while she does not report frank cataplectic attacks, she feels drained after having had an emotional conversation. She denies hypnagogic or pump hallucinations. She has no history of sleep attacks. She does get overwhelmingly sleepy and feels that she has to take a nap and it can be several hours. She even sleeps when she is at work and her lunchtime. She drinks caffeine 2 times per day. She does not drink alcohol, as she has a strong FHx of alcoholism. She does not smoke.  She reports excessive daytime somnolence (EDS) and Her Epworth Sleepiness Score (ESS) is 13/24 today. She has dozed off driving, but now knows to pull over. She reports dreaming in a nap. Singulair made her act out in her dreams. She used to sleep talk and sleep walk as a child.  She does not snore and has not had apneas or sense of gasping. There is report of nighttime reflux, with rare nighttime cough experienced. The patient has not noted any RLS symptoms and is not known to kick while asleep or before falling asleep. There is no family history of RLS or OSA. Her bedroom is usually dark and cool. There is a TV in the bedroom and usually it is not on at night.   Her Past Medical History Is Significant For: Past Medical History  Diagnosis Date  . Arthritis   .  Depression   . Asthma   . GERD (gastroesophageal reflux disease)     Her Past Surgical History Is Significant For: Past Surgical History  Procedure Laterality Date  . Breast surgery    . Cesarean section      Her Family History Is Significant For: Family History  Problem Relation Age of Onset  . Depression Father     Strong paternal family Hx of depression  . Depression Sister     Her Social History Is Significant For: History   Social History  . Marital Status: Single    Spouse Name: N/A    Number of Children: N/A  . Years of Education: N/A   Social History Main  Topics  . Smoking status: Never Smoker   . Smokeless tobacco: None  . Alcohol Use: No  . Drug Use: No  . Sexual Activity: None   Other Topics Concern  . None   Social History Narrative  . None    Her Allergies Are:  Allergies  Allergen Reactions  . Meperidine Nausea Only  :   Her Current Medications Are:  Outpatient Encounter Prescriptions as of 10/28/2013  Medication Sig  . acetaminophen (TYLENOL) 500 MG tablet Take 1,000 mg by mouth every 6 (six) hours as needed. For pain  . albuterol (PROVENTIL) (2.5 MG/3ML) 0.083% nebulizer solution Take 2.5 mg by nebulization every 6 (six) hours as needed for wheezing or shortness of breath.  . beclomethasone (QVAR) 40 MCG/ACT inhaler Inhale 2 puffs into the lungs 2 (two) times daily.  . diphenhydrAMINE (BENADRYL) 25 mg capsule Take 50 mg by mouth every 6 (six) hours as needed. For allergies  . Melatonin 5 MG TABS Take 1 tablet by mouth at bedtime as needed and may repeat dose one time if needed.  . busPIRone (BUSPAR) 15 MG tablet Take 15 mg by mouth 2 (two) times daily.  Marland Kitchen escitalopram (LEXAPRO) 20 MG tablet Take 20 mg by mouth daily.  Marland Kitchen HYDROcodone-acetaminophen (VICODIN) 5-500 MG per tablet Take 1 tablet by mouth every 6 (six) hours as needed. For pain  . montelukast (SINGULAIR) 10 MG tablet Take 10 mg by mouth at bedtime.  Marland Kitchen omeprazole (PRILOSEC) 10 MG capsule Take 10 mg by mouth daily.  . ondansetron (ZOFRAN ODT) 8 MG disintegrating tablet Take 1 tablet (8 mg total) by mouth every 8 (eight) hours as needed for nausea.  :  Review of Systems:  Out of a complete 14 point review of systems, all are reviewed and negative with the exception of these symptoms as listed below:   Review of Systems  Constitutional: Positive for appetite change, fatigue and unexpected weight change.  HENT: Negative.   Eyes: Negative.   Respiratory: Negative.   Cardiovascular: Negative.   Gastrointestinal: Negative.   Endocrine: Negative.   Genitourinary:  Negative.   Musculoskeletal: Negative.   Skin: Negative.   Allergic/Immunologic: Positive for environmental allergies.  Neurological: Negative.   Hematological: Negative.   Psychiatric/Behavioral: Positive for sleep disturbance (e.d.s., ).    Objective:  Neurologic Exam  Physical Exam Physical Examination:   Filed Vitals:   10/28/13 0848  BP: 128/77  Pulse: 75  Temp: 98 F (36.7 C)    General Examination: The patient is a very pleasant 53 y.o. female in no acute distress. She appears well-developed and well-nourished and well groomed. She is overweight.  HEENT: Normocephalic, atraumatic, pupils are equal, round and reactive to light and accommodation. Funduscopic exam is normal with sharp disc margins noted. She wears corrective  lenses. Extraocular tracking is good without limitation to gaze excursion or nystagmus noted. Normal smooth pursuit is noted. Hearing is grossly intact. Tympanic membranes are clear bilaterally. Face is symmetric with normal facial animation and normal facial sensation. Speech is clear with no dysarthria noted. There is no hypophonia. There is no lip, neck/head, jaw or voice tremor. Neck is supple with full range of passive and active motion. There are no carotid bruits on auscultation. Oropharynx exam reveals: mild mouth dryness, adequate dental hygiene and mild airway crowding, due to narrow airway entry and elongated tongue. Mallampati is class I. Tongue protrudes centrally and palate elevates symmetrically. Tonsils are small in size. Nasal inspection reveals no significant nasal mucosal bogginess or redness and no septal deviation.   Chest: Clear to auscultation without wheezing, rhonchi or crackles noted.  Heart: S1+S2+0, regular and normal without murmurs, rubs or gallops noted.   Abdomen: Soft, non-tender and non-distended with normal bowel sounds appreciated on auscultation.  Extremities: There is no pitting edema in the distal lower extremities  bilaterally. Pedal pulses are intact.  Skin: Warm and dry without trophic changes noted. There are no varicose veins. She has residual evidence of poison ivy in her left forearm and both legs. She states she was treated with prednisone.  Musculoskeletal: exam reveals no obvious joint deformities, tenderness or joint swelling or erythema.   Neurologically:  Mental status: The patient is awake, alert and oriented in all 4 spheres. Her immediate and remote memory, attention, language skills and fund of knowledge are appropriate. There is no evidence of aphasia, agnosia, apraxia or anomia. Speech is clear with normal prosody and enunciation. Thought process is linear. Mood is normal and affect is normal.  Cranial nerves II - XII are as described above under HEENT exam. In addition: shoulder shrug is normal with equal shoulder height noted. Motor exam: Normal bulk, strength and tone is noted. There is no drift, tremor or rebound. Romberg is negative. Reflexes are 2+ throughout. Babinski: Toes are flexor bilaterally. Fine motor skills and coordination: intact with normal finger taps, normal hand movements, normal rapid alternating patting, normal foot taps and normal foot agility.  Cerebellar testing: No dysmetria or intention tremor on finger to nose testing. Heel to shin is unremarkable bilaterally. There is no truncal or gait ataxia.  Sensory exam: intact to light touch, pinprick, vibration, temperature sense in the upper and lower extremities.  Gait, station and balance: She stands easily. No veering to one side is noted. No leaning to one side is noted. Posture is age-appropriate and stance is narrow based. Gait shows normal stride length and normal pace. No problems turning are noted. She turns en bloc. Tandem walk is unremarkable. Intact toe and heel stance is noted.               Assessment and Plan:   In summary, Yolanda Marshall is a very pleasant 53 y.o.-year old female with an underlying  medical history of depression, obesity, allergic rhinitis, and asthma, who has been experiencing significant daytime somnolence for the past several years, at least 10 years. 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 While she does not have a telltale history of narcolepsy including sleep attacks, she does report significant daytime somnolence and the need to take sometimes prolonged naps. 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 I do believe her to hypersomnolence is out of proportion to any underlying mood disorder and medication effect. In fact, she has been off of BuSpar and Lexapro for about a  month and a half. She does not plan to start back on Lexapro. She does have some residual anxiety and may restart BuSpar after sleep testing. Her physical exam is nonfocal which is reassuring. She reports some morning headaches. She has a history of migraine headaches. She denies any sleep disordered breathing and I would have a low suspicion of obstructive sleep disordered breathing in her case.  I had a long chat with the patient about my findings and the symptom of excessive sleepiness. We briefly talked about the differential diagnosis of narcolepsy without cataplexy versus idiopathic hypersomnolence in her case. We also talked about medical treatments and non-pharmacological approaches. I would like to proceed with an overnight polysomnogram followed by a daytime nap test (MSLT). I don't think that narcolepsy can be completely excluded at this moment. While She does not endorse frank cataplexy, She has had sleep paralysis episodes. I also advised her that it can be normal to have sleep paralysis infrequently in your lifetime.  She is agreeable to coming back for further sleep study testing. I will see her back soon afterwards. Thank you very much for allowing me to participate in the care of this nice patient. If I can be of any further assistance to you please do not hesitate to call me at  [phone].  Sincerely,   Huston FoleySaima Athar, MD, PhD

## 2014-07-22 ENCOUNTER — Other Ambulatory Visit (HOSPITAL_BASED_OUTPATIENT_CLINIC_OR_DEPARTMENT_OTHER): Payer: Self-pay | Admitting: Family Medicine

## 2014-07-22 DIAGNOSIS — Z1231 Encounter for screening mammogram for malignant neoplasm of breast: Secondary | ICD-10-CM

## 2014-07-31 ENCOUNTER — Ambulatory Visit (HOSPITAL_BASED_OUTPATIENT_CLINIC_OR_DEPARTMENT_OTHER)
Admission: RE | Admit: 2014-07-31 | Discharge: 2014-07-31 | Disposition: A | Discharge status: Home / Self Care | Payer: 59 | Source: Ambulatory Visit | Attending: Family Medicine | Admitting: Family Medicine

## 2014-07-31 DIAGNOSIS — Z1231 Encounter for screening mammogram for malignant neoplasm of breast: Secondary | ICD-10-CM | POA: Diagnosis not present

## 2016-10-26 ENCOUNTER — Other Ambulatory Visit (HOSPITAL_BASED_OUTPATIENT_CLINIC_OR_DEPARTMENT_OTHER): Payer: Self-pay | Admitting: Ophthalmology

## 2016-10-26 DIAGNOSIS — H5347 Heteronymous bilateral field defects: Secondary | ICD-10-CM

## 2016-10-28 ENCOUNTER — Ambulatory Visit (HOSPITAL_BASED_OUTPATIENT_CLINIC_OR_DEPARTMENT_OTHER)
Admission: RE | Admit: 2016-10-28 | Discharge: 2016-10-28 | Disposition: A | Discharge status: Home / Self Care | Payer: 59 | Source: Ambulatory Visit | Attending: Ophthalmology | Admitting: Ophthalmology

## 2016-10-28 DIAGNOSIS — H5347 Heteronymous bilateral field defects: Secondary | ICD-10-CM | POA: Diagnosis not present

## 2016-10-28 MED ORDER — GADOBENATE DIMEGLUMINE 529 MG/ML IV SOLN: 15.0000 mL | Freq: Once | INTRAVENOUS | Status: DC | PRN

## 2018-03-28 IMAGING — MR MR ORBITS WO/W CM
8 series · 48 of 48 positions shown · IV contrast (multihance)
Comparison: None.

CLINICAL DATA: Peripheral vision loss. Heteronymous bilateral
visual field defects. Flashing lights.

EXAM:
MRI OF THE ORBITS WITHOUT AND WITH CONTRAST
TECHNIQUE: Multiplanar, multisequence MR imaging of the orbits was performed
both before and after the administration of intravenous contrast.
CONTRAST:  MultiHance 15 mL.

[Series 2: T1 · sagittal · 5.0mm · 0.90mm/px · 5 of 23 slices shown (1 of 3)]
[im 1/23]
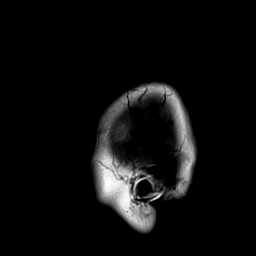
[im 6/23]
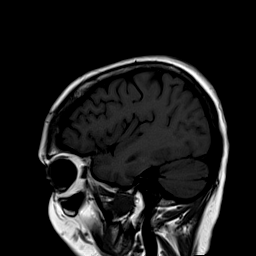
[im 12/23]
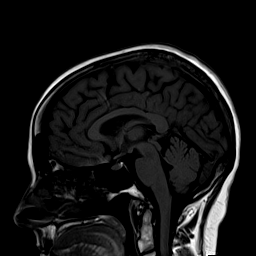
[im 17/23]
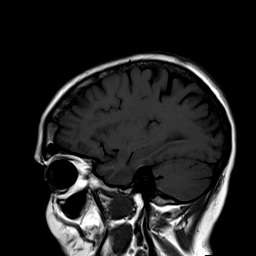
[im 23/23]
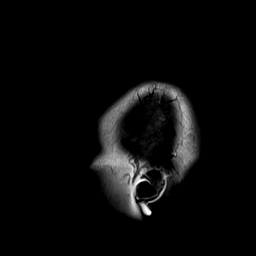

[Series 3: T2 fat-sat · axial · 3.0mm · 0.62mm/px · z∈[-40,+26]mm · 6 of 21 slices shown (1 of 2)]
[im 1/21]
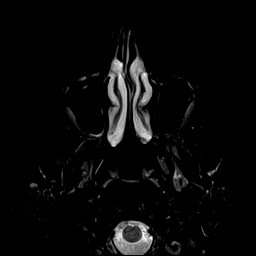
[im 5/21]
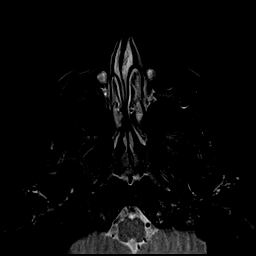
[im 9/21]
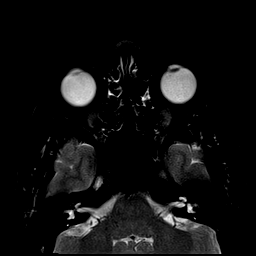
[im 13/21]
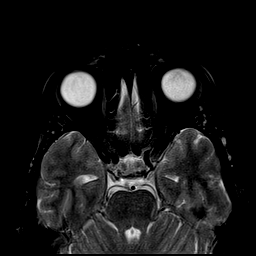
[im 17/21]
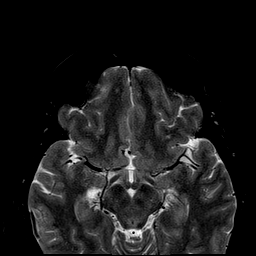
[im 21/21]
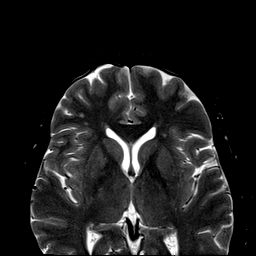

[Series 4: T2 fat-sat · coronal · 3.0mm · 0.62mm/px · 8 of 30 slices shown (2 of 2)]
[im 1/30]
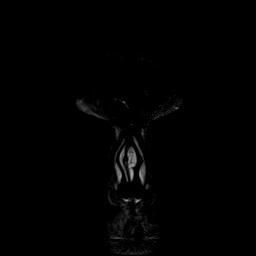
[im 5/30]
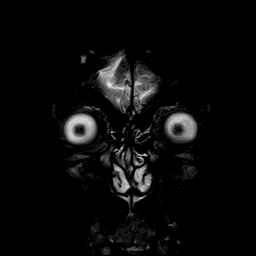
[im 9/30]
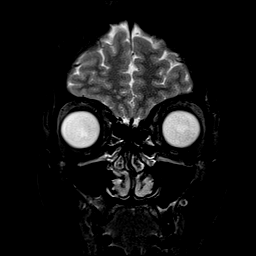
[im 13/30]
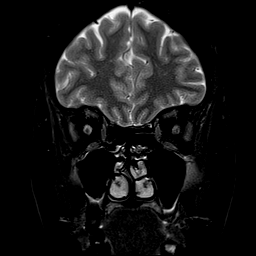
[im 17/30]
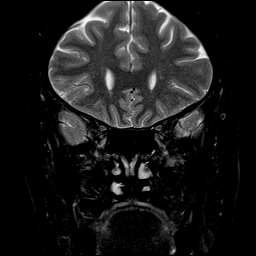
[im 21/30]
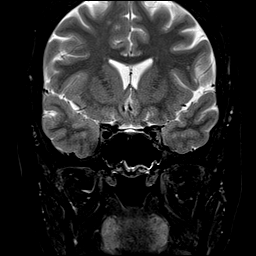
[im 25/30]
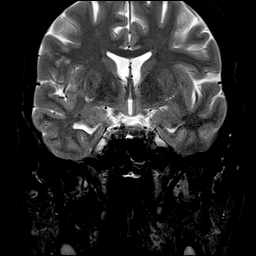
[im 30/30]
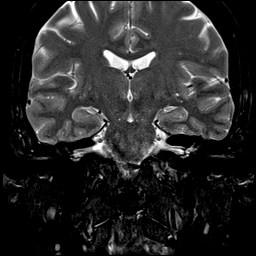

[Series 5: T1 · axial · 3.0mm · 0.62mm/px · z∈[-40,+26]mm · 6 of 21 slices shown (2 of 3)]
[im 1/21]
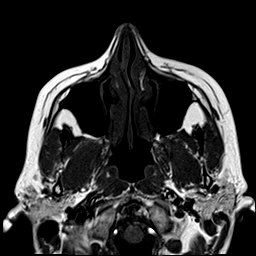
[im 5/21]
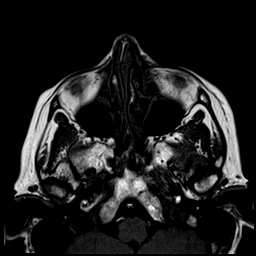
[im 9/21]
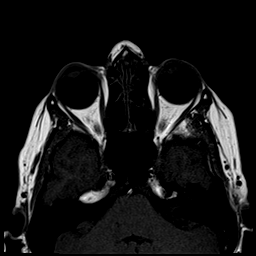
[im 13/21]
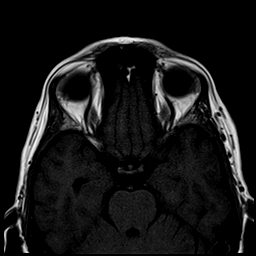
[im 17/21]
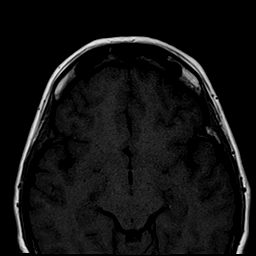
[im 21/21]
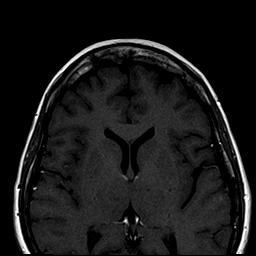

[Series 6: T1 · coronal · 3.0mm · 0.62mm/px · 8 of 30 slices shown (3 of 3)]
[im 1/30]
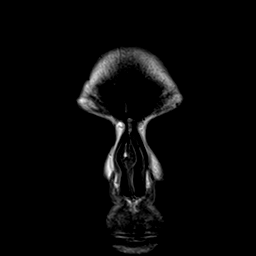
[im 5/30]
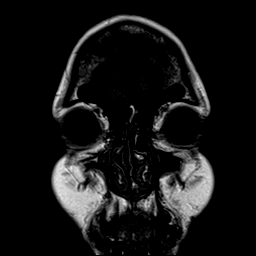
[im 9/30]
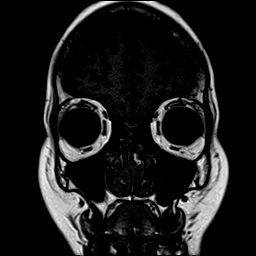
[im 13/30]
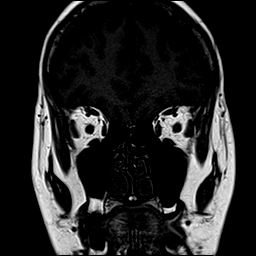
[im 17/30]
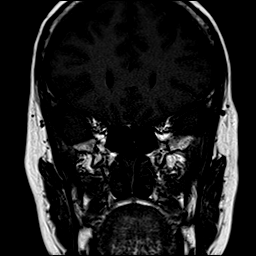
[im 21/30]
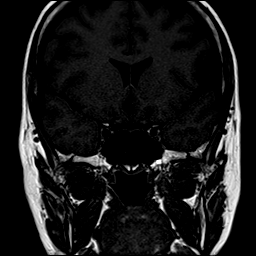
[im 25/30]
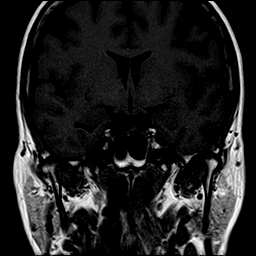
[im 30/30]
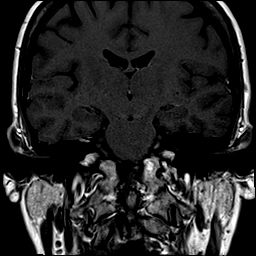

[Series 7: T1 fat-sat · axial · 3.0mm · 0.62mm/px · z∈[-40,+26]mm · 6 of 21 slices shown (1 of 2)]
[im 1/21]
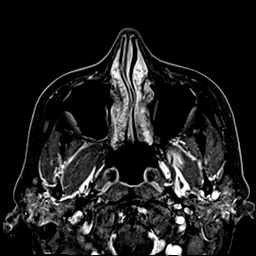
[im 5/21]
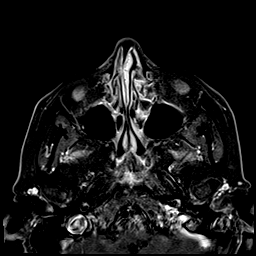
[im 9/21]
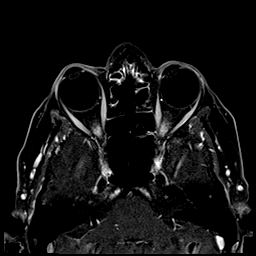
[im 13/21]
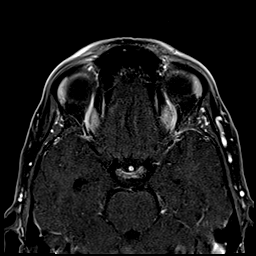
[im 17/21]
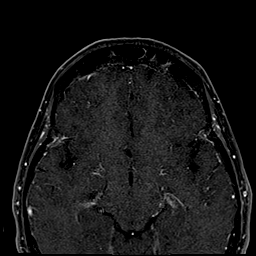
[im 21/21]
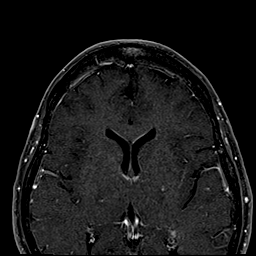

[Series 8: T1 fat-sat · coronal · 3.0mm · 0.62mm/px · 8 of 30 slices shown (2 of 2)]
[im 1/30]
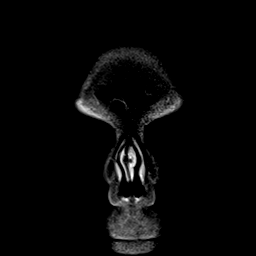
[im 5/30]
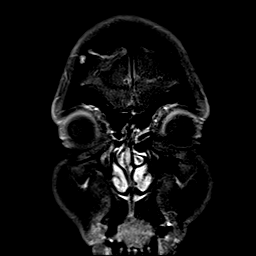
[im 9/30]
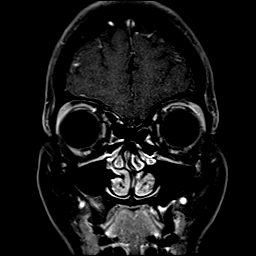
[im 13/30]
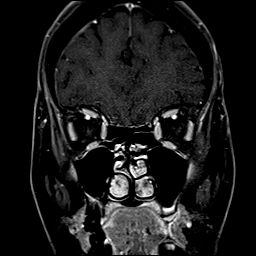
[im 17/30]
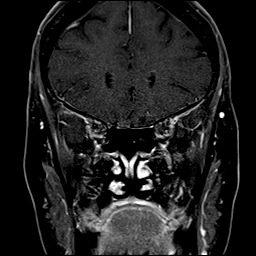
[im 21/30]
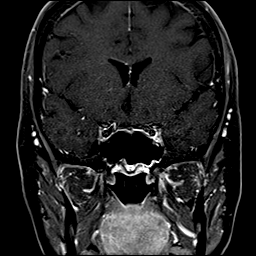
[im 25/30]
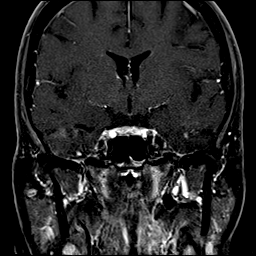
[im 30/30]
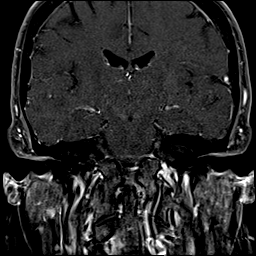

[Series 100: hx · axial · 3.0mm · 0.62mm/px · 1 of 1 slices shown]
[im 1/1]
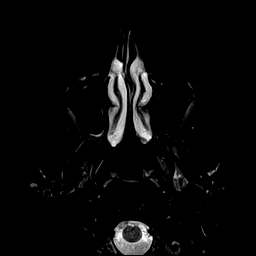

[48 of 48 positions shown; findings below may reference images not displayed]

FINDINGS: Globes are symmetric. Normal-appearing optic nerves. No orbital
masses. The extra-ocular muscles are normal in size and symmetric.
Normal lacrimal glands. No evidence stranding of the retrobulbar
fat, or proptosis. Conjugate gaze. No blowout injury.

Intracranial optic pathways are normal. Symmetric optic chiasm. No
pituitary or cavernous sinus lesion. Suprasellar cistern is filled
with a normal amount of CSF.

Normal visualized regional paranasal sinuses. There may be mild
BILATERAL subcortical white matter signal abnormality, incompletely
evaluated. These findings can be seen in chronic small vessel
disease, complicated migraine, vasculitis, chronic infection, or
idiopathic. No features strongly suggestive of demyelinating disease
in the periventricular white matter.
IMPRESSION: Negative MRI of the orbits without and with contrast. No cause is
seen for the reported symptoms and ophthalmologic findings.
